# Patient Record
Sex: Male | Born: 1955 | Race: White | Hispanic: No | Marital: Married | Smoking: Former smoker
Health system: Southern US, Community
[De-identification: ages and names within clinical notes are randomized; demographics above are authoritative.]

## PROBLEM LIST (undated history)

## (undated) DIAGNOSIS — D499 Neoplasm of unspecified behavior of unspecified site: Secondary | ICD-10-CM

## (undated) DIAGNOSIS — M1990 Unspecified osteoarthritis, unspecified site: Secondary | ICD-10-CM

## (undated) DIAGNOSIS — E213 Hyperparathyroidism, unspecified: Secondary | ICD-10-CM

## (undated) DIAGNOSIS — I10 Essential (primary) hypertension: Secondary | ICD-10-CM

## (undated) HISTORY — PX: TONSILLECTOMY: SUR1361

## (undated) HISTORY — DX: Essential (primary) hypertension: I10

---

## 1996-03-05 HISTORY — PX: BACK SURGERY: SHX140

## 2005-09-18 ENCOUNTER — Encounter: Admission: RE | Admit: 2005-09-18 | Discharge: 2005-09-18 | Payer: Self-pay | Admitting: General Surgery

## 2005-11-13 ENCOUNTER — Ambulatory Visit (HOSPITAL_BASED_OUTPATIENT_CLINIC_OR_DEPARTMENT_OTHER): Admission: RE | Admit: 2005-11-13 | Discharge: 2005-11-13 | Payer: Self-pay | Admitting: General Surgery

## 2006-03-05 HISTORY — PX: HERNIA REPAIR: SHX51

## 2008-02-06 ENCOUNTER — Encounter: Admission: RE | Admit: 2008-02-06 | Discharge: 2008-02-06 | Payer: Self-pay | Admitting: General Surgery

## 2009-01-21 ENCOUNTER — Ambulatory Visit (HOSPITAL_BASED_OUTPATIENT_CLINIC_OR_DEPARTMENT_OTHER): Admission: RE | Admit: 2009-01-21 | Discharge: 2009-01-21 | Payer: Self-pay | Admitting: Urology

## 2009-09-19 ENCOUNTER — Ambulatory Visit (HOSPITAL_COMMUNITY): Admission: RE | Admit: 2009-09-19 | Discharge: 2009-09-19 | Payer: Self-pay | Admitting: Urology

## 2010-06-07 LAB — POCT I-STAT 4, (NA,K, GLUC, HGB,HCT)
Glucose, Bld: 107 mg/dL — ABNORMAL HIGH (ref 70–99)
HCT: 49 % (ref 39.0–52.0)
Hemoglobin: 16.7 g/dL (ref 13.0–17.0)
Potassium: 4.6 meq/L (ref 3.5–5.1)
Sodium: 141 meq/L (ref 135–145)

## 2010-07-21 NOTE — Op Note (Signed)
NAMEABIEL, ANTRIM               ACCOUNT NO.:  [id number]   MEDICAL RECORD NO.:  [id number]          PATIENT TYPE:  AMB   LOCATION:  [facility]                         FACILITY:  [facility]   PHYSICIAN:  Sharlet Salina T. Hoxworth, M.D.DATE OF BIRTH:  03-31-1955   DATE OF PROCEDURE:  11/13/2005  DATE OF DISCHARGE:                                 OPERATIVE REPORT   PREOPERATIVE DIAGNOSIS:  Left inguinal hernia.   POSTOPERATIVE DIAGNOSIS:  Left inguinal hernia.   SURGICAL PROCEDURES:  Repair of left inguinal hernia with mesh.   SURGEON:  Lorne Skeens. Hoxworth, M.D.   ANESTHESIA:  General.   BRIEF HISTORY:  Mr. Shellhammer is a 55 year old male with a gradually enlarging  and quite symptomatic left inguinal hernia confirmed on exam.  It was  difficult to reduce fully in the office.  I have recommended proceeding with  repair under general anesthesia with mesh.  The nature of the procedure,  indications, risks of bleeding, infection and recurrence of chronic pain  were discussed and understood.  He is now brought to the operating room for  this procedure.   DESCRIPTION OF OPERATION:  The patient was brought to the operating room and  placed in supine position on the operating table and general orotracheal  anesthesia was induced.  The left groin was sterilely prepped and draped.  He was given preoperative antibiotics.  The correct patient and procedure  were verified.  An oblique incision was made in the left groin and  dissection was carried down through subcutaneous tissue using cautery.  External oblique was identified and cleared and then divided along the lines  of its fibers to the external ring.  The ilioinguinal nerve was identified  and dissected free and protected throughout the remainder of the case.  The  cord was dissected up off the floor of the pubic tubercle.  Cremasteric  fibers were divided bilaterally up to the internal ring.  There was a large  cord lipoma that was dissected free  from cord structures using blunt and  cautery dissection and then clamped, divided and tied with 3-0 Vicryl.  There was noted to be a small to moderate direct hernia.  Dissection within  the cord revealed a large indirect sac.  It was completely dissected free  from cord structures up to the level of the internal ring, at which point it  was suture-ligated and divided and the stump retracted nicely up into the  dilated internal ring.  A piece of Parietex mesh was then trimmed to size to  fit the floor of the inguinal canal with tails going around the cord at the  internal ring.  The mesh was sutured initially to the pubic tubercle and  then to the inguinal ligament, working medial to lateral, with running 2-0  Prolene.  Medially the mesh was sutured to the edge of the rectus sheath  with interrupted 2-0 Prolene.  The tails were then tacked together lateral  to the internal ring, creating a new ring snug to a fingertip.  This  provided nice broad coverage of the direct and indirect spaces.  The cord  and ilioinguinal nerves were returned to their anatomic position.  The  external oblique was closed over this with running  3-0 Vicryl.  Scarpa fascia was closed with running 3-0 Vicryl and the skin  closed with running subcuticular 4-0 Monocryl and Steri-Strips.  Sponge and  needle counts correct.  A dry sterile dressing was applied and the patient  taken to the recovery room in good condition.      Lorne Skeens. Hoxworth, M.D.  Electronically Signed     BTH/MEDQ  D:  11/13/2005  T:  11/13/2005  Job:  [id number]

## 2013-10-13 ENCOUNTER — Encounter: Payer: Self-pay | Admitting: Internal Medicine

## 2013-10-13 ENCOUNTER — Ambulatory Visit (INDEPENDENT_AMBULATORY_CARE_PROVIDER_SITE_OTHER): Payer: BC Managed Care – PPO | Admitting: Internal Medicine

## 2013-10-13 NOTE — Progress Notes (Signed)
Patient ID: Fernando Johnson, male   DOB: [birth year], 58 y.o.   MRN: [id number]   HPI  Fernando Johnson is a 58 y.o.-year-old male, referred by his PCP, Dr. Melinda Crutch, for evaluation for hypercalcemia/hyperparathyroidism. He is here with his wife, who offers part of the history.  Pt was dx with hypercalcemia in 2015. I reviewed pt's pertinent labs (reviewed records from Dr. Harrington Challenger) - he had a high calcium, high PTH, and stage 2-3 CKD:  09/07/2013:  Calcium 11.6 (8.6-10.3) sodium 135 (136-145) BUN 20, creatinine 1.25, GFR 60 the rest of the CMP normal 09/21/2013: Calcium 11.6 Ionized calcium 6.4 (4.5-5.6) Intact PTH 67 (50-65) BUN 32, creatinine 1.43, GFR 51 the rest of the CMP normal  He had a lumbar fracture >> fell from a tree (years ago).  No h/o kidney stones. 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   Pt is not on (and has not been on) HCTZ.  No AP, constipation, depression.  No h/o vitamin D deficiency. No vit D level available to review.   Pt is not on calcium and vitamin D supplements; he also drinks milk but not a lot of green, leafy, vegetables.  Pt does not have a FH of hypercalcemia, pituitary tumors, thyroid cancer, or osteoporosis. Mother had scleroderma >> was on HD.   I reviewed his chart and he also has a history of HTN, inguinal hernia Sx and hydrocelle.   ROS: Constitutional: no weight gain/loss, no fatigue, no subjective hyperthermia/hypothermia Eyes: no blurry vision, no xerophthalmia ENT: no sore throat, no nodules palpated in throat, no dysphagia/odynophagia, no hoarseness Cardiovascular: no CP/SOB/palpitations/leg swelling Respiratory: no cough/SOB Gastrointestinal: no N/V/D/C Musculoskeletal: + both muscle/joint aches (back) Skin: no rashes, + easy bruising Neurological: no tremors/numbness/tingling/dizziness Psychiatric: no depression/anxiety  Past Medical History  Diagnosis Date  . Hypertension    hyperlipidemia  Past  Surgical History  Procedure Laterality Date  . Hernia repair  2008  . Back surgery  1998   History   Social History Main Topics  . Smoking status: Former Smoker    Quit date: 03/05/1990  . Smokeless tobacco: Not on file  . Alcohol Use: 2.0 oz/week    4 drink(s) per week  . Drug Use: No   Social History Narrative   Married   2 children (ages 45, 87 yrs)   Passenger transport manager   Quit smoking in [facility]  Name  Route  Sig  Dispense  Refill  . aspirin 81 MG tablet   Oral   Take 81 mg by mouth daily.         Marland Kitchen lisinopril (PRINIVIL,ZESTRIL) 20 MG tablet   Oral   Take 10 mg by mouth daily.          NKDA  Family History  Problem Relation Age of Onset  . Cancer Mother   . Hypertension Father   . Heart disease Father   . Hypertension Brother   . Hyperlipidemia Brother   . Heart disease Brother   . Cancer Brother   . Cancer Maternal Grandfather   . Heart disease Paternal Grandfather    PE: BP 118/60  Pulse 66  Temp(Src) 98.4 F (36.9 C) (Oral)  Resp 12  Ht 5' 10.5" (1.791 m)  Wt 212 lb (96.163 kg)  BMI 29.98 kg/m2  SpO2 96% Wt Readings from Last 3 Encounters:  10/13/13 212 lb (96.163 kg)   Constitutional: overweight, in NAD. No kyphosis. Ruddy complexion. Eyes: PERRLA, EOMI,  no exophthalmos ENT: moist mucous membranes, no thyromegaly, no cervical lymphadenopathy Cardiovascular: RRR, No MRG Respiratory: CTA B Gastrointestinal: abdomen soft, NT, ND, BS+ Musculoskeletal: no deformities, strength intact in all 4 Skin: moist, warm, no rashes Neurological: no tremor with outstretched hands, DTR normal in all 4  Assessment: 1. Hypercalcemia/hyperparathyroidism  Plan: Patient has had 2 instances of elevated calcium, at 11.6. A corresponding intact PTH level was also high, at 67. 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  No apparent complications from hypercalcemia: no h/o nephrolithiasis, no osteoporosis, no fractures (other than a traumatic fx after a fall from tree). No abdominal pain, depression, bone pain. - I discussed with the patient about the physiology of calcium and parathyroid hormone, and possible side effects from increased PTH, including kidney stones, osteoporosis, abdominal pain, etc.  - We discussed that we need to check whether his hyperparathyroidism is primary (Familial hypercalcemic hypocalciuria or parathyroid adenoma) or secondary (to conditions like: vitamin D deficiency, calcium malabsorption, hypercalciuria, renal insufficiency, etc.). - we discussed about criteria for parathyroid surgery - he meets 2:  Increased calcium by more than 1 mg/dL above the upper limit of normal  Kidney ds.  Osteoporosis (or Vb fx) Age <61 years old New (2013): High UCa >400 mg/d and increased stone risk by biochemical stone risk analysis Presence of nephrolithiasis or nephrocalcinosis Pt's preference!  - today we will check: calcium level intact PTH Magnesium Phosphorus vitamin D 25 HO  Vitamin D 1,25 HO Will check 24h urinary calcium if vit D normal - pt given instructions for urine collection and the jug - If the tests indicate a parathyroid adenoma, I will refer him to surgery. We discussed possible consequences of hyperparathyroidism: ~1/3 pts will develop complications over 15 years (OP, nephrolithiasis). He however, has high Ca levels >> would definitely go ahead with surgery if the labs indicate a PHPTH. He and his wife agree with plan. - for now, advised to stay well hydrated - I will wait for the results of the above labs and will discuss with the plan with the patient.  - I will see the patient back in 3 months  Component     Latest Ref Rng 10/13/2013  Sodium     135 - 145 mEq/L 136  Potassium     3.5 - 5.1 mEq/L 4.5  Chloride     96 - 112 mEq/L 109  CO2     19 - 32 mEq/L 22   Glucose     70 - 99 mg/dL 87  BUN     6 - 23 mg/dL 31 (H)  Creatinine     0.4 - 1.5 mg/dL 1.4  Calcium     8.4 - 10.5 mg/dL 11.5 (H)  GFR     >60.00 mL/min 58.07 (L)  Vitamin D 1, 25 (OH) Total     18 - 72 pg/mL 76 (H)  Vitamin D3 1, 25 (OH)      76  Vitamin D2 1, 25 (OH)      <8  PTH     14.0 - 72.0 pg/mL 115.2 (H)  VITD     30.00 - 100.00 ng/mL 33.96  Magnesium     1.5 - 2.5 mg/dL 2.0  Phosphorus     2.3 - 4.6 mg/dL 2.5  Free T4     0.60 - 1.60 ng/dL  1.09  T3, Free     2.3 - 4.2 pg/mL 2.8  TSH     0.35 - 4.50 uIU/mL 0.52  PTH ([facility]) and calcium still high. Vit D normal. 1,25 diHO vitamin D is also high. Mg normal. Phos normal. TFTs normal. He has slightly low kidney fxn.  Will check a 24 h urine calcium. Component     Latest Ref Rng 10/19/2013  Calcium, Ur      11  Calcium, 24 hour urine     100 - 250 mg/day 374 (H)  Creatinine, Urine      64.3  Creatinine, 24H Ur     800 - 2000 mg/day 2185 (H)  24h Ca high.  I believe he has primary hyperparathyroidism >> will refer to surgery.

## 2013-10-13 NOTE — Patient Instructions (Signed)
Please stay well hydrated. Please stop at the lab. Please try to join MyChart for easier communication. I will send you the results through [place]. I will let you know when to start the urine collection.  Patient information (Up-to-Date): Collection of a 24-hour urine specimen  - You should collect every drop of urine during each 24-hour period. It does not matter how much or little urine is passed each time, as long as every drop is collected. - Begin the urine collection in the morning after you wake up, after you have emptied your bladder for the first time. - Urinate (empty the bladder) for the first time and flush it down the toilet. Note the exact time (eg, 6:15 AM). You will begin the urine collection at this time. - Collect every drop of urine during the day and night in an empty collection bottle. Store the bottle at room temperature or in the refrigerator. - If you need to have a bowel movement, any urine passed with the bowel movement should be collected. Try not to include feces with the urine collection. If feces does get mixed in, do not try to remove the feces from the urine collection bottle. - Finish by collecting the first urine passed the next morning, adding it to the collection bottle. This should be within ten minutes before or after the time of the first morning void on the first day (which was flushed). In this example, you would try to void between 6:05 and 6:25 on the second day. - If you need to urinate one hour before the final collection time, drink a full glass of water so that you can void again at the appropriate time. If you have to urinate 20 minutes before, try to hold the urine until the proper time. - Please note the exact time of the final collection, even if it is not the same time as when collection began on day 1. - The bottle(s) may be kept at room temperature for a day or two, but should be kept cool or refrigerated for longer periods of time.  Please  return in 3 months with your sugar log.

## 2013-10-14 LAB — VITAMIN D 25 HYDROXY (VIT D DEFICIENCY, FRACTURES): VITD: 33.96 ng/mL (ref 30.00–100.00)

## 2013-10-14 LAB — T4, FREE: Free T4: 1.09 ng/dL (ref 0.60–1.60)

## 2013-10-14 LAB — BASIC METABOLIC PANEL
BUN: 31 mg/dL — ABNORMAL HIGH (ref 6–23)
CALCIUM: 11.5 mg/dL — AB (ref 8.4–10.5)
CO2: 22 meq/L (ref 19–32)
Chloride: 109 mEq/L (ref 96–112)
Creatinine, Ser: 1.4 mg/dL (ref 0.4–1.5)
GFR: 58.07 mL/min — ABNORMAL LOW (ref >60.00)
Glucose, Bld: 87 mg/dL (ref 70–99)
Potassium: 4.5 mEq/L (ref 3.5–5.1)
Sodium: 136 mEq/L (ref 135–145)

## 2013-10-14 LAB — PHOSPHORUS: Phosphorus: 2.5 mg/dL (ref 2.3–4.6)

## 2013-10-14 LAB — MAGNESIUM: MAGNESIUM: 2 mg/dL (ref 1.5–2.5)

## 2013-10-14 LAB — TSH: TSH: 0.52 u[IU]/mL (ref 0.35–4.50)

## 2013-10-14 LAB — T3, FREE: T3, Free: 2.8 pg/mL (ref 2.3–4.2)

## 2013-10-15 LAB — PARATHYROID HORMONE, INTACT (NO CA): PTH: 115.2 pg/mL — AB (ref 14.0–72.0)

## 2013-10-18 LAB — VITAMIN D 1,25 DIHYDROXY
VITAMIN D 1, 25 (OH) TOTAL: 76 pg/mL — AB (ref 18–72)
Vitamin D3 1, 25 (OH)2: 76 pg/mL

## 2013-10-19 ENCOUNTER — Other Ambulatory Visit: Payer: BC Managed Care – PPO

## 2013-10-20 ENCOUNTER — Encounter: Payer: Self-pay | Admitting: Internal Medicine

## 2013-10-20 LAB — CREATININE, URINE, 24 HOUR
CREATININE, URINE: 64.3 mg/dL
Creatinine, 24H Ur: 2185 mg/d — ABNORMAL HIGH (ref 800–2000)

## 2013-10-20 LAB — CALCIUM, URINE, 24 HOUR
Calcium, 24 hour urine: 374 mg/d — ABNORMAL HIGH (ref 100–250)
Calcium, Ur: 11 mg/dL

## 2013-11-16 ENCOUNTER — Ambulatory Visit (INDEPENDENT_AMBULATORY_CARE_PROVIDER_SITE_OTHER): Payer: BC Managed Care – PPO | Admitting: Surgery

## 2013-11-16 ENCOUNTER — Other Ambulatory Visit (INDEPENDENT_AMBULATORY_CARE_PROVIDER_SITE_OTHER): Payer: Self-pay

## 2013-11-16 DIAGNOSIS — E213 Hyperparathyroidism, unspecified: Secondary | ICD-10-CM

## 2013-11-26 ENCOUNTER — Encounter (HOSPITAL_COMMUNITY)
Admission: RE | Admit: 2013-11-26 | Discharge: 2013-11-26 | Disposition: A | Discharge status: Home / Self Care | Payer: BC Managed Care – PPO | Source: Ambulatory Visit | Attending: Surgery | Admitting: Surgery

## 2013-11-26 ENCOUNTER — Encounter (HOSPITAL_COMMUNITY)
Admission: RE | Admit: 2013-11-26 | Discharge: 2013-11-26 | Disposition: A | Discharge status: Home / Self Care | Payer: BC Managed Care – PPO | Source: Ambulatory Visit | Attending: Diagnostic Radiology | Admitting: Diagnostic Radiology

## 2013-11-26 DIAGNOSIS — E213 Hyperparathyroidism, unspecified: Secondary | ICD-10-CM | POA: Insufficient documentation

## 2013-11-26 MED ORDER — TECHNETIUM TC 99M SESTAMIBI - CARDIOLITE
25.6000 | Freq: Once | INTRAVENOUS | Status: AC | PRN
  Administered 2013-11-26: 10:00:00 26 via INTRAVENOUS

## 2013-11-30 ENCOUNTER — Telehealth (INDEPENDENT_AMBULATORY_CARE_PROVIDER_SITE_OTHER): Payer: Self-pay

## 2013-11-30 NOTE — Telephone Encounter (Signed)
Pt calling for scan result. To Dr Harlow Asa for review. Noted in allscripts.

## 2013-12-06 NOTE — Progress Notes (Signed)
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 Patient is a good candidate for minimally invasive out-patient surgery.  Left message on phones. Will contact on 10/5 to proceed with scheduling.  Earnstine Regal, MD, [facility] Office: [phone]    ______

## 2013-12-07 ENCOUNTER — Other Ambulatory Visit (INDEPENDENT_AMBULATORY_CARE_PROVIDER_SITE_OTHER): Payer: Self-pay | Admitting: Surgery

## 2013-12-14 ENCOUNTER — Encounter (HOSPITAL_COMMUNITY): Payer: Self-pay | Admitting: Pharmacy Technician

## 2013-12-16 ENCOUNTER — Encounter (HOSPITAL_COMMUNITY): Payer: Self-pay

## 2013-12-16 ENCOUNTER — Ambulatory Visit (HOSPITAL_COMMUNITY)
Admission: RE | Admit: 2013-12-16 | Discharge: 2013-12-16 | Disposition: A | Discharge status: Home / Self Care | Payer: BC Managed Care – PPO | Source: Ambulatory Visit | Attending: Anesthesiology | Admitting: Anesthesiology

## 2013-12-16 ENCOUNTER — Encounter (HOSPITAL_COMMUNITY)
Admission: RE | Admit: 2013-12-16 | Discharge: 2013-12-16 | Disposition: A | Discharge status: Home / Self Care | Payer: BC Managed Care – PPO | Source: Ambulatory Visit | Attending: Surgery | Admitting: Surgery

## 2013-12-16 DIAGNOSIS — E210 Primary hyperparathyroidism: Secondary | ICD-10-CM | POA: Diagnosis not present

## 2013-12-16 DIAGNOSIS — Z01818 Encounter for other preprocedural examination: Secondary | ICD-10-CM | POA: Insufficient documentation

## 2013-12-16 DIAGNOSIS — I10 Essential (primary) hypertension: Secondary | ICD-10-CM

## 2013-12-16 HISTORY — DX: Hyperparathyroidism, unspecified: E21.3

## 2013-12-16 HISTORY — DX: Unspecified osteoarthritis, unspecified site: M19.90

## 2013-12-16 HISTORY — DX: Neoplasm of unspecified behavior of unspecified site: D49.9

## 2013-12-16 LAB — CBC
HEMATOCRIT: 51.1 % (ref 39.0–52.0)
Hemoglobin: 18 g/dL — ABNORMAL HIGH (ref 13.0–17.0)
MCH: 31.2 pg (ref 26.0–34.0)
MCHC: 35.2 g/dL (ref 30.0–36.0)
MCV: 88.6 fL (ref 78.0–100.0)
Platelets: 122 10*3/uL — ABNORMAL LOW (ref 150–400)
RBC: 5.77 MIL/uL (ref 4.22–5.81)
RDW: 13.2 % (ref 11.5–15.5)
WBC: 7.5 10*3/uL (ref 4.0–10.5)

## 2013-12-16 LAB — BASIC METABOLIC PANEL
Anion gap: 12 (ref 5–15)
BUN: 24 mg/dL — AB (ref 6–23)
CHLORIDE: 103 meq/L (ref 96–112)
CO2: 23 meq/L (ref 19–32)
CREATININE: 1.34 mg/dL (ref 0.50–1.35)
Calcium: 11.6 mg/dL — ABNORMAL HIGH (ref 8.4–10.5)
GFR calc non Af Amer: 58 mL/min — ABNORMAL LOW (ref >90)
GFR, EST AFRICAN AMERICAN: 67 mL/min — AB (ref >90)
GLUCOSE: 116 mg/dL — AB (ref 70–99)
Potassium: 4.6 mEq/L (ref 3.7–5.3)
Sodium: 138 mEq/L (ref 137–147)

## 2013-12-16 NOTE — Progress Notes (Signed)
12/16/13 1441  OBSTRUCTIVE SLEEP APNEA  Have you ever been diagnosed with sleep apnea through a sleep study? No  Do you snore loudly (loud enough to be heard through closed doors)?  0  Do you often feel tired, fatigued, or sleepy during the daytime? 0 (NOW FEELING TIRED DUE TO HYPERPARATHYROID PROBLEM)  Has anyone observed you stop breathing during your sleep? 0  Do you have, or are you being treated for high blood pressure? 1  BMI more than 35 kg/m2? 0  Age over 41 years old? 1  Neck circumference greater than 40 cm/16 inches? 1  Gender: 1  Obstructive Sleep Apnea Score 4

## 2013-12-16 NOTE — Pre-Procedure Instructions (Signed)
EKG AND CXR WERE DONE TODAY - PREOP AT [facility]. 

## 2013-12-16 NOTE — Patient Instructions (Addendum)
YOUR SURGERY IS SCHEDULED AT [facility]  ON:  Tuesday  10/20  REPORT TO  SHORT STAY CENTER AT:  5:30 AM   DO NOT EAT OR DRINK ANYTHING AFTER MIDNIGHT THE NIGHT BEFORE YOUR SURGERY.  YOU MAY BRUSH YOUR TEETH, RINSE OUT YOUR MOUTH--BUT NO WATER, NO FOOD, NO CHEWING GUM, NO MINTS, NO CANDIES, NO CHEWING TOBACCO.  PLEASE TAKE THE FOLLOWING MEDICATIONS THE AM OF YOUR SURGERY WITH A FEW SIPS OF WATER:  NO MEDICATIONS TO TAKE THE AM OF SURGERY    DO NOT BRING VALUABLES, MONEY, CREDIT CARDS.  DO NOT WEAR JEWELRY, MAKE-UP, NAIL POLISH AND NO METAL PINS OR CLIPS IN YOUR HAIR. CONTACT LENS, DENTURES / PARTIALS, GLASSES SHOULD NOT BE WORN TO SURGERY AND IN MOST CASES-HEARING AIDS WILL NEED TO BE REMOVED.  BRING YOUR GLASSES CASE, ANY EQUIPMENT NEEDED FOR YOUR CONTACT LENS. FOR PATIENTS ADMITTED TO THE HOSPITAL--CHECK OUT TIME THE DAY OF DISCHARGE IS 11:00 AM.  ALL INPATIENT ROOMS ARE PRIVATE - WITH BATHROOM, TELEPHONE, TELEVISION AND WIFI INTERNET.  IF YOU ARE BEING DISCHARGED THE SAME DAY OF YOUR SURGERY--YOU CAN NOT DRIVE YOURSELF HOME--AND SHOULD NOT GO HOME ALONE BY TAXI OR BUS.  NO DRIVING OR OPERATING MACHINERY, OR MAKING LEGAL DECISIONS FOR 24 HOURS FOLLOWING ANESTHESIA / PAIN MEDICATIONS.  PLEASE MAKE ARRANGEMENTS FOR SOMEONE TO BE WITH YOU AT HOME THE FIRST 24 HOURS AFTER SURGERY. RESPONSIBLE DRIVER'S NAME / PHONE      PT'S WIFE WILL BE WITH HIM  CELL [phone]                                                     PLEASE BE AWARE THAT YOU MAY NEED ADDITIONAL BLOOD DRAWN DAY OF YOUR SURGERY  _______________________________________________________________________   [facility] - Preparing for Surgery Before surgery, you can play an important role.  Because skin is not sterile, your skin needs to be as free of germs as possible.  You can reduce the number of germs on your skin by washing with CHG (chlorahexidine gluconate) soap before surgery.  CHG is an antiseptic cleaner which  kills germs and bonds with the skin to continue killing germs even after washing. Please DO NOT use if you have an allergy to CHG or antibacterial soaps.  If your skin becomes reddened/irritated stop using the CHG and inform your nurse when you arrive at Short Stay. Do not shave (including legs and underarms) for at least 48 hours prior to the first CHG shower.  You may shave your face/neck. Please follow these instructions carefully:  1.  Shower with CHG Soap the night before surgery and the  morning of Surgery.  2.  If you choose to wash your hair, wash your hair first as usual with your  normal  shampoo.  3.  After you shampoo, rinse your hair and body thoroughly to remove the  shampoo.                           4.  Use CHG as you would any other liquid soap.  You can apply chg directly  to the skin and wash                       Gently with a scrungie or  clean washcloth.  5.  Apply the CHG Soap to your body ONLY FROM THE NECK DOWN.   Do not use on face/ open                           Wound or open sores. Avoid contact with eyes, ears mouth and genitals (private parts).                       Wash face,  Genitals (private parts) with your normal soap.             6.  Wash thoroughly, paying special attention to the area where your surgery  will be performed.  7.  Thoroughly rinse your body with warm water from the neck down.  8.  DO NOT shower/wash with your normal soap after using and rinsing off  the CHG Soap.                9.  Pat yourself dry with a clean towel.            10.  Wear clean pajamas.            11.  Place clean sheets on your bed the night of your first shower and do not  sleep with pets. Day of Surgery : Do not apply any lotions/deodorants the morning of surgery.  Please wear clean clothes to the hospital/surgery center.  FAILURE TO FOLLOW THESE INSTRUCTIONS MAY RESULT IN THE CANCELLATION OF YOUR SURGERY PATIENT SIGNATURE_________________________________  NURSE  SIGNATURE__________________________________  ________________________________________________________________________

## 2013-12-17 NOTE — Progress Notes (Signed)
Quick Note:  Pre-operative chest x-ray is acceptable for scheduled surgery.  Fernando Gracy M. Ziyah Cordoba, MD, [facility] Office: [phone]   ______ 

## 2013-12-17 NOTE — Progress Notes (Signed)
Quick Note:  These results are acceptable for scheduled surgery.  Celsey Asselin M. Jaimon Bugaj, MD, [facility] Office: [phone]   ______ 

## 2013-12-17 NOTE — Progress Notes (Signed)
Quick Note:  EKG is acceptable for scheduled surgery.  Landis Cassaro M. Nylah Butkus, MD, [facility] Office: [phone]   ______ 

## 2013-12-21 ENCOUNTER — Encounter (HOSPITAL_COMMUNITY): Payer: Self-pay | Admitting: Anesthesiology

## 2013-12-21 NOTE — Anesthesia Preprocedure Evaluation (Signed)
Anesthesia Evaluation  Patient identified by MRN, date of birth, ID band Patient awake    Reviewed: Allergy & Precautions, H&P , NPO status , Patient's Chart, lab work & pertinent test results  Airway Mallampati: II TM Distance: >3 FB Neck ROM: Full    Dental no notable dental hx.    Pulmonary former smoker,  breath sounds clear to auscultation  Pulmonary exam normal       Cardiovascular hypertension, Pt. on medications Rhythm:Regular Rate:Normal     Neuro/Psych negative neurological ROS  negative psych ROS   GI/Hepatic negative GI ROS, Neg liver ROS,   Endo/Other  negative endocrine ROS  Renal/GU negative Renal ROS  negative genitourinary   Musculoskeletal  (+) Arthritis -,   Abdominal (+) + obese,   Peds negative pediatric ROS (+)  Hematology negative hematology ROS (+)   Anesthesia Other Findings   Reproductive/Obstetrics negative OB ROS                           Anesthesia Physical Anesthesia Plan  ASA: II  Anesthesia Plan: General   Post-op Pain Management:    Induction: Intravenous  Airway Management Planned: Oral ETT  Additional Equipment:   Intra-op Plan:   Post-operative Plan: Extubation in OR  Informed Consent: I have reviewed the patients History and Physical, chart, labs and discussed the procedure including the risks, benefits and alternatives for the proposed anesthesia with the patient or authorized representative who has indicated his/her understanding and acceptance.   Dental advisory given  Plan Discussed with: CRNA  Anesthesia Plan Comments:         Anesthesia Quick Evaluation

## 2013-12-22 ENCOUNTER — Encounter (HOSPITAL_COMMUNITY): Payer: Self-pay | Admitting: *Deleted

## 2013-12-22 ENCOUNTER — Ambulatory Visit (HOSPITAL_COMMUNITY)
Admission: RE | Admit: 2013-12-22 | Discharge: 2013-12-22 | Disposition: A | Discharge status: Home / Self Care | Payer: BC Managed Care – PPO | Source: Ambulatory Visit | Attending: Surgery | Admitting: Surgery

## 2013-12-22 ENCOUNTER — Ambulatory Visit (HOSPITAL_COMMUNITY): Payer: BC Managed Care – PPO | Admitting: Anesthesiology

## 2013-12-22 ENCOUNTER — Encounter (HOSPITAL_COMMUNITY): Payer: BC Managed Care – PPO | Admitting: Anesthesiology

## 2013-12-22 ENCOUNTER — Other Ambulatory Visit (INDEPENDENT_AMBULATORY_CARE_PROVIDER_SITE_OTHER): Payer: Self-pay

## 2013-12-22 ENCOUNTER — Encounter (HOSPITAL_COMMUNITY)
Admission: RE | Disposition: A | Discharge status: Home / Self Care | Payer: Self-pay | Source: Ambulatory Visit | Attending: Surgery

## 2013-12-22 DIAGNOSIS — I10 Essential (primary) hypertension: Secondary | ICD-10-CM | POA: Insufficient documentation

## 2013-12-22 DIAGNOSIS — E210 Primary hyperparathyroidism: Secondary | ICD-10-CM | POA: Insufficient documentation

## 2013-12-22 DIAGNOSIS — E213 Hyperparathyroidism, unspecified: Secondary | ICD-10-CM

## 2013-12-22 HISTORY — PX: PARATHYROIDECTOMY: SHX19

## 2013-12-22 SURGERY — PARATHYROIDECTOMY
Anesthesia: General | Site: Neck

## 2013-12-22 MED ORDER — CISATRACURIUM BESYLATE 20 MG/10ML IV SOLN
INTRAVENOUS | Status: AC
  Filled 2013-12-22: qty 10

## 2013-12-22 MED ORDER — PROPOFOL 10 MG/ML IV BOLUS
INTRAVENOUS | Status: AC
  Filled 2013-12-22: qty 20

## 2013-12-22 MED ORDER — MIDAZOLAM HCL 5 MG/5ML IJ SOLN
INTRAMUSCULAR | Status: DC | PRN
  Administered 2013-12-22: 2 mg via INTRAVENOUS

## 2013-12-22 MED ORDER — HYDROMORPHONE HCL 1 MG/ML IJ SOLN
0.2500 mg | INTRAMUSCULAR | Status: DC | PRN
  Administered 2013-12-22 (×3): 0.5 mg via INTRAVENOUS

## 2013-12-22 MED ORDER — 0.9 % SODIUM CHLORIDE (POUR BTL) OPTIME
TOPICAL | Status: DC | PRN
  Administered 2013-12-22: 1000 mL

## 2013-12-22 MED ORDER — HYDROCODONE-ACETAMINOPHEN 5-325 MG PO TABS: 1.0000 | ORAL_TABLET | ORAL | Status: AC | PRN

## 2013-12-22 MED ORDER — DEXAMETHASONE SODIUM PHOSPHATE 10 MG/ML IJ SOLN
INTRAMUSCULAR | Status: AC
  Filled 2013-12-22: qty 1

## 2013-12-22 MED ORDER — HYDROMORPHONE HCL 1 MG/ML IJ SOLN
INTRAMUSCULAR | Status: AC
  Filled 2013-12-22: qty 1

## 2013-12-22 MED ORDER — MIDAZOLAM HCL 2 MG/2ML IJ SOLN
INTRAMUSCULAR | Status: AC
  Filled 2013-12-22: qty 2

## 2013-12-22 MED ORDER — DEXAMETHASONE SODIUM PHOSPHATE 10 MG/ML IJ SOLN
INTRAMUSCULAR | Status: DC | PRN
  Administered 2013-12-22: 10 mg via INTRAVENOUS

## 2013-12-22 MED ORDER — FENTANYL CITRATE 0.05 MG/ML IJ SOLN
INTRAMUSCULAR | Status: AC
  Filled 2013-12-22: qty 5

## 2013-12-22 MED ORDER — GLYCOPYRROLATE 0.2 MG/ML IJ SOLN
INTRAMUSCULAR | Status: DC | PRN
  Administered 2013-12-22: .6 mg via INTRAVENOUS

## 2013-12-22 MED ORDER — CISATRACURIUM BESYLATE (PF) 10 MG/5ML IV SOLN
INTRAVENOUS | Status: DC | PRN
  Administered 2013-12-22: 6 mg via INTRAVENOUS

## 2013-12-22 MED ORDER — ONDANSETRON HCL 4 MG/2ML IJ SOLN
INTRAMUSCULAR | Status: DC | PRN
  Administered 2013-12-22: 4 mg via INTRAVENOUS

## 2013-12-22 MED ORDER — CEFAZOLIN SODIUM-DEXTROSE 2-3 GM-% IV SOLR
2.0000 g | INTRAVENOUS | Status: AC
Start: 2013-12-22 — End: 2013-12-22
  Administered 2013-12-22: 2 g via INTRAVENOUS

## 2013-12-22 MED ORDER — BUPIVACAINE HCL (PF) 0.25 % IJ SOLN
INTRAMUSCULAR | Status: AC
  Filled 2013-12-22: qty 30

## 2013-12-22 MED ORDER — CEFAZOLIN SODIUM-DEXTROSE 2-3 GM-% IV SOLR
INTRAVENOUS | Status: AC
  Filled 2013-12-22: qty 50

## 2013-12-22 MED ORDER — PROMETHAZINE HCL 25 MG/ML IJ SOLN: 6.2500 mg | INTRAMUSCULAR | Status: DC | PRN

## 2013-12-22 MED ORDER — BUPIVACAINE HCL 0.25 % IJ SOLN
INTRAMUSCULAR | Status: DC | PRN
  Administered 2013-12-22: 10 mL

## 2013-12-22 MED ORDER — LIDOCAINE HCL (CARDIAC) 20 MG/ML IV SOLN
INTRAVENOUS | Status: DC | PRN
  Administered 2013-12-22: 60 mg via INTRAVENOUS

## 2013-12-22 MED ORDER — NEOSTIGMINE METHYLSULFATE 10 MG/10ML IV SOLN
INTRAVENOUS | Status: DC | PRN
  Administered 2013-12-22: 4 mg via INTRAVENOUS

## 2013-12-22 MED ORDER — LIDOCAINE HCL (CARDIAC) 20 MG/ML IV SOLN
INTRAVENOUS | Status: AC
  Filled 2013-12-22: qty 5

## 2013-12-22 MED ORDER — LACTATED RINGERS IV SOLN: INTRAVENOUS | Status: DC

## 2013-12-22 MED ORDER — GLYCOPYRROLATE 0.2 MG/ML IJ SOLN
INTRAMUSCULAR | Status: AC
  Filled 2013-12-22: qty 3

## 2013-12-22 MED ORDER — NEOSTIGMINE METHYLSULFATE 10 MG/10ML IV SOLN
INTRAVENOUS | Status: AC
  Filled 2013-12-22: qty 1

## 2013-12-22 MED ORDER — PROPOFOL 10 MG/ML IV BOLUS
INTRAVENOUS | Status: DC | PRN
  Administered 2013-12-22: 150 mg via INTRAVENOUS

## 2013-12-22 MED ORDER — FENTANYL CITRATE 0.05 MG/ML IJ SOLN
INTRAMUSCULAR | Status: DC | PRN
  Administered 2013-12-22: 100 ug via INTRAVENOUS
  Administered 2013-12-22: 50 ug via INTRAVENOUS

## 2013-12-22 MED ORDER — LACTATED RINGERS IV SOLN
INTRAVENOUS | Status: DC | PRN
  Administered 2013-12-22: 07:00:00 via INTRAVENOUS

## 2013-12-22 MED ORDER — HYDROCODONE-ACETAMINOPHEN 5-325 MG PO TABS
1.0000 | ORAL_TABLET | ORAL | Status: DC | PRN
  Administered 2013-12-22: 1 via ORAL
  Filled 2013-12-22: qty 1

## 2013-12-22 MED ORDER — ONDANSETRON HCL 4 MG/2ML IJ SOLN
INTRAMUSCULAR | Status: AC
  Filled 2013-12-22: qty 2

## 2013-12-22 MED ORDER — SUCCINYLCHOLINE CHLORIDE 20 MG/ML IJ SOLN
INTRAMUSCULAR | Status: DC | PRN
  Administered 2013-12-22: 100 mg via INTRAVENOUS

## 2013-12-22 SURGICAL SUPPLY — 40 items
ATTRACTOMAT 16X20 MAGNETIC DRP (DRAPES) ×3 IMPLANT
BENZOIN TINCTURE PRP APPL 2/3 (GAUZE/BANDAGES/DRESSINGS) ×3 IMPLANT
BLADE HEX COATED 2.75 (ELECTRODE) ×3 IMPLANT
BLADE SURG 15 STRL LF DISP TIS (BLADE) ×1 IMPLANT
BLADE SURG 15 STRL SS (BLADE) ×2
CANISTER SUCTION 2500CC (MISCELLANEOUS) ×3 IMPLANT
CHLORAPREP W/TINT 10.5 ML (MISCELLANEOUS) IMPLANT
CHLORAPREP W/TINT 26ML (MISCELLANEOUS) ×3 IMPLANT
CLIP TI MEDIUM 6 (CLIP) ×6 IMPLANT
CLIP TI WIDE RED SMALL 6 (CLIP) ×6 IMPLANT
CLOSURE WOUND 1/2 X4 (GAUZE/BANDAGES/DRESSINGS) ×1
DISSECTOR ROUND CHERRY 3/8 STR (MISCELLANEOUS) ×3 IMPLANT
DRAPE PED LAPAROTOMY (DRAPES) ×3 IMPLANT
DRESSING SURGICEL FIBRLLR 1X2 (HEMOSTASIS) ×1 IMPLANT
DRSG SURGICEL FIBRILLAR 1X2 (HEMOSTASIS) ×3
ELECT REM PT RETURN 9FT ADLT (ELECTROSURGICAL) ×3
ELECTRODE REM PT RTRN 9FT ADLT (ELECTROSURGICAL) ×1 IMPLANT
GAUZE SPONGE 4X4 12PLY STRL (GAUZE/BANDAGES/DRESSINGS) ×3 IMPLANT
GAUZE SPONGE 4X4 16PLY XRAY LF (GAUZE/BANDAGES/DRESSINGS) ×3 IMPLANT
GLOVE SURG ORTHO 8.0 STRL STRW (GLOVE) ×3 IMPLANT
GOWN STRL REUS W/TWL XL LVL3 (GOWN DISPOSABLE) ×6 IMPLANT
KIT BASIN OR (CUSTOM PROCEDURE TRAY) ×3 IMPLANT
NEEDLE HYPO 25X1 1.5 SAFETY (NEEDLE) ×3 IMPLANT
NS IRRIG 1000ML POUR BTL (IV SOLUTION) ×3 IMPLANT
PACK BASIC VI WITH GOWN DISP (CUSTOM PROCEDURE TRAY) ×3 IMPLANT
PENCIL BUTTON HOLSTER BLD 10FT (ELECTRODE) ×3 IMPLANT
STAPLER VISISTAT 35W (STAPLE) ×3 IMPLANT
STRIP CLOSURE SKIN 1/2X4 (GAUZE/BANDAGES/DRESSINGS) ×2 IMPLANT
SUT MNCRL AB 4-0 PS2 18 (SUTURE) ×3 IMPLANT
SUT SILK 2 0 (SUTURE)
SUT SILK 2-0 18XBRD TIE 12 (SUTURE) IMPLANT
SUT SILK 3 0 (SUTURE)
SUT SILK 3-0 18XBRD TIE 12 (SUTURE) IMPLANT
SUT VIC AB 3-0 SH 18 (SUTURE) ×3 IMPLANT
SYR BULB IRRIGATION 50ML (SYRINGE) ×3 IMPLANT
SYR CONTROL 10ML LL (SYRINGE) ×3 IMPLANT
TAPE CLOTH SURG 4X10 WHT LF (GAUZE/BANDAGES/DRESSINGS) ×3 IMPLANT
TOWEL OR 17X26 10 PK STRL BLUE (TOWEL DISPOSABLE) ×3 IMPLANT
TOWEL OR NON WOVEN STRL DISP B (DISPOSABLE) ×3 IMPLANT
YANKAUER SUCT BULB TIP 10FT TU (MISCELLANEOUS) ×3 IMPLANT

## 2013-12-22 NOTE — H&P (Signed)
General Surgery [facility]  Reed Pandy. Starkey 11/16/2013 11:22 AM Location: [facility] Patient #: [id number] DOB: Jan 05, 1956 Married / Language: Fernando Johnson / Race: White Male  History of Present Illness Earnstine Regal MD; 11/16/2013 12:02 PM) The patient is a 58 year old male who presents with a parathyroid neoplasm. Patient is referred by Dr. Philemon Kingdom for evaluation of suspected primary hyperparathyroidism. Patient's primary care physician is Dr. Myriam Jacobson. Patient had been noted on routine laboratory studies to have a persistently elevated serum calcium level. Recent calcium level is 11.2 with an intact PTH level of 67. Vitamin D level was normal at 76. 24-hour urine collection for calcium was elevated at 374. Patient is referred at this time for further evaluation and recommendations. Patient has no prior history of endocrine disease. He has had no prior head or neck surgery. There is no family history of parathyroid disease and no history of other endocrine neoplasms.   Other Problems [facility] [place], RMA; 11/16/2013 11:22 AM) High blood pressure Inguinal Hernia  Past Surgical History [facility] [place], RMA; 11/16/2013 11:22 AM) Open Inguinal Hernia Surgery Left. Spinal Surgery - Lower Back Tonsillectomy  Diagnostic Studies History [facility] [place], [place]; 11/16/2013 11:22 AM) Colonoscopy 1-5 years ago  Allergies [facility] [place], RMA; 11/16/2013 11:22 AM) No Known Drug Allergies09/14/2015  Medication History (Dahionnarah Maldonado, RMA; 11/16/2013 11:24 AM) Lisinopril (20MG  Tablet, Oral) Active. Aspirin Adult Low Dose (81MG  Tablet DR, Oral) Active. Fish Oil (1000MG  Capsule DR, Oral) Active. Medications Reconciled  Social History Alexander Bergeron [place], [place]; 11/16/2013 11:22 AM) Alcohol use Occasional alcohol use. Caffeine use Coffee. Illicit drug use Remotely quit drug use. Tobacco use Former  smoker.  Family History Alexander Bergeron [place], [place]; 11/16/2013 11:22 AM) Cancer Brother, Mother. Cerebrovascular Accident Mother. Heart Disease Brother, Father.  Review of Systems ([place] RMA; 11/16/2013 11:22 AM) General Not Present- Appetite Loss, Chills, Fatigue, Fever, Night Sweats, Weight Gain and Weight Loss. Skin Not Present- Change in Wart/Mole, Dryness, Hives, Jaundice, New Lesions, Non-Healing Wounds, Rash and Ulcer. HEENT Not Present- Earache, Hearing Loss, Hoarseness, Nose Bleed, Oral Ulcers, Ringing in the Ears, Seasonal Allergies, Sinus Pain, Sore Throat, Visual Disturbances, Wears glasses/contact lenses and Yellow Eyes. Respiratory Not Present- Bloody sputum, Chronic Cough, Difficulty Breathing, Snoring and Wheezing. Breast Not Present- Breast Mass, Breast Pain, Nipple Discharge and Skin Changes. Cardiovascular Not Present- Chest Pain, Difficulty Breathing Lying Down, Leg Cramps, Palpitations, Rapid Heart Rate, Shortness of Breath and Swelling of Extremities. Gastrointestinal Not Present- Abdominal Pain, Bloating, Bloody Stool, Change in Bowel Habits, Chronic diarrhea, Constipation, Difficulty Swallowing, Excessive gas, Gets full quickly at meals, Hemorrhoids, Indigestion, Nausea, Rectal Pain and Vomiting. Male Genitourinary Not Present- Blood in Urine, Change in Urinary Stream, Frequency, Impotence, Nocturia, Painful Urination, Urgency and Urine Leakage. Musculoskeletal Not Present- Back Pain, Joint Pain, Joint Stiffness, Muscle Pain, Muscle Weakness and Swelling of Extremities. Neurological Not Present- Decreased Memory, Fainting, Headaches, Numbness, Seizures, Tingling, Tremor, Trouble walking and Weakness. Psychiatric Not Present- Anxiety, Bipolar, Change in Sleep Pattern, Depression, Fearful and Frequent crying. Endocrine Not Present- Cold Intolerance, Excessive Hunger, Hair Changes, Heat Intolerance, Hot flashes and New Diabetes. Hematology Present- Easy  Bruising. Not Present- Excessive bleeding, Gland problems, HIV and Persistent Infections.   Vitals (Dahionnarah Maldonado RMA; 11/16/2013 11:26 AM) 11/16/2013 11:25 AM Weight: 217.8 lb Height: 71in Body Surface Area: 2.22 m Body Mass Index: 30.38 kg/m Temp.: 66F(Oral)  Pulse: 70 (Regular)  P.OX: 97% (Room air) BP: 96/68 (Sitting, Left Arm, Standard)    Physical  Exam Earnstine Regal MD; 11/16/2013 12:03 PM) General Mental Status-Alert. General Appearance-Consistent with stated age. Hydration-Well hydrated. Voice-Normal.  Head and Neck Head-normocephalic, atraumatic with no lesions or palpable masses. Trachea-midline. Thyroid Gland Characteristics - normal size and consistency. Note: Thyroid lobes are soft and normal in consistency bilaterally. No palpable nodules. No palpable masses. No tenderness. No anterior or posterior cervical lymphadenopathy.  Eye Eyeball - Bilateral-Extraocular movements intact. Sclera/Conjunctiva - Bilateral-No scleral icterus.  Chest and Lung Exam Chest and lung exam reveals -quiet, even and easy respiratory effort with no use of accessory muscles, normal resonance, no flatness or dullness, non-tender and normal tactile fremitus and on auscultation, normal breath sounds, no adventitious sounds and normal vocal resonance. Inspection Chest Wall - Normal. Back - normal.  Cardiovascular Cardiovascular examination reveals -on palpation PMI is normal in location and amplitude, no palpable S3 or S4. Normal cardiac borders., normal heart sounds, regular rate and rhythm with no murmurs, carotid auscultation reveals no bruits and normal pedal pulses bilaterally.  Neurologic Neurologic evaluation reveals -alert and oriented x 3 with no impairment of recent or remote memory. Mental Status-Normal.  Musculoskeletal Normal Exam - Left-Upper Extremity Strength Normal and Lower Extremity Strength Normal. Normal Exam -  Right-Upper Extremity Strength Normal, Lower Extremity Weakness.    Assessment & Plan Earnstine Regal MD; 11/16/2013 12:05 PM) PRIMARY HYPERPARATHYROIDISM (252.01  E21.0) Impression: I discussed the above findings at length with the patient and his wife.I have recommended proceeding with nuclear medicine parathyroid scan. We discussed the possibility of a single gland adenoma.This would be amenable to minimally invasive surgery. We discussed the risk and benefits of this procedure including the risk of recurrent laryngeal nerve injury. We discussed the possibility of a second gland adenoma and the potential need for additional surgery in the future. Patient understands and wishes to proceed with further testing anticipating having outpatient minimally invasive surgery at some point in the near future.  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  Will proceed with minimally invasive parathyroidectomy.  The risks and benefits of the procedure have been discussed at length with the patient.  The patient understands the proposed procedure, potential alternative treatments, and the course of recovery to be expected.  All of the patient's questions have been answered at this time.  The patient wishes to proceed with surgery.  Earnstine Regal, MD, [facility] Office: [phone]

## 2013-12-22 NOTE — Transfer of Care (Signed)
Immediate Anesthesia Transfer of Care Note  Patient: Fernando Johnson  Procedure(s) Performed: Procedure(s): PARATHYROIDECTOMY (N/A)  Patient Location: PACU  Anesthesia Type:General  Level of Consciousness: awake, alert , oriented and patient cooperative  Airway & Oxygen Therapy: Patient Spontanous Breathing and Patient connected to face mask oxygen  Post-op Assessment: Report given to PACU RN and Post -op Vital signs reviewed and stable  Post vital signs: Reviewed and stable  Complications: No apparent anesthesia complications

## 2013-12-22 NOTE — Op Note (Signed)
OPERATIVE REPORT - PARATHYROIDECTOMY  Preoperative diagnosis: Primary hyperparathyroidism  Postop diagnosis: Same  Procedure: Right superior minimally invasive parathyroidectomy  Surgeon:  Earnstine Regal, MD, FACS  Anesthesia: Gen. endotracheal  Estimated blood loss: Minimal  Preparation: ChloraPrep  Indications: Patient had been noted on routine laboratory studies to have a persistently elevated serum calcium level. Recent calcium level is 11.2 with an intact PTH level of 67. Vitamin D level was normal at 76. 24-hour urine collection for calcium was elevated at 374. Nuclear medicine parathyroid scan localizes an adenoma to the right inferior position.  Procedure: Patient was prepared in the holding area. He was brought to operating room and placed in a supine position on the operating room table. Following administration of general anesthesia, the patient was positioned and then prepped and draped in the usual strict aseptic fashion. After ascertaining that an adequate level of anesthesia been achieved, a neck incision was made with a #15 blade. Dissection was carried through subcutaneous tissues and platysma. Hemostasis was obtained with the electrocautery. Skin flaps were developed circumferentially and a Weitlander retractor was placed for exposure.  Strap muscles were incised in the midline. Strap muscles were reflected exposing the thyroid lobe. With gentle blunt dissection the thyroid lobe was mobilized.  Dissection was carried through adipose tissue and an enlarged parathyroid gland was identified. It was gently mobilized. This appeared to be a superior gland based on the orientation of the vasculature. Vascular structures were divided between small and medium ligaclips. Care was taken to avoid the recurrent laryngeal nerve and the esophagus. The parathyroid gland was completely excised. It was submitted to pathology where frozen section confirmed parathyroid tissue consistent with  adenoma.  Neck was irrigated with warm saline and good hemostasis was noted. Fibrillar was placed in the operative field. Strap muscles were reapproximated in the midline with interrupted 3-0 Vicryl sutures. Platysma was closed with interrupted 3-0 Vicryl sutures. Skin was closed with a running 4-0 Monocryl subcuticular suture. Marcaine was infiltrated circumferentially. Wound was washed and dried and benzoin and Steri-Strips were applied. Sterile gauze dressings were applied. Patient was awakened from anesthesia and brought to the recovery room. The patient tolerated the procedure well.   Earnstine Regal, MD, [facility]

## 2013-12-22 NOTE — Anesthesia Postprocedure Evaluation (Signed)
  Anesthesia Post-op Note  Patient: Fernando Johnson  Procedure(s) Performed: Procedure(s) (LRB): PARATHYROIDECTOMY (N/A)  Patient Location: PACU  Anesthesia Type: General  Level of Consciousness: awake and alert   Airway and Oxygen Therapy: Patient Spontanous Breathing  Post-op Pain: mild  Post-op Assessment: Post-op Vital signs reviewed, Patient's Cardiovascular Status Stable, Respiratory Function Stable, Patent Airway and No signs of Nausea or vomiting  Last Vitals:  Filed Vitals:   12/22/13 1043  BP: 113/68  Pulse: 65  Temp:   Resp: 16    Post-op Vital Signs: stable   Complications: No apparent anesthesia complications

## 2013-12-23 ENCOUNTER — Encounter (HOSPITAL_COMMUNITY): Payer: Self-pay | Admitting: Surgery

## 2014-01-15 ENCOUNTER — Ambulatory Visit: Payer: BC Managed Care – PPO | Admitting: Internal Medicine

## 2014-01-16 LAB — PTH, INTACT AND CALCIUM
Calcium: 9.2 mg/dL (ref 8.7–10.2)
PTH: 35 pg/mL (ref 15–65)

## 2015-04-19 IMAGING — NM NM PARATHYROID W/ SPECT
4 series · 24 of 24 positions shown · non-contrast
Comparison: None.

CLINICAL DATA: Hyperparathyroidism.

EXAM:
NM PARATHYROID SCINTIGRAPHY AND SPECT IMAGING
TECHNIQUE: Following intravenous administration of radiopharmaceutical, early
and 2-hour delayed planar images were obtained in the anterior
projection. Delayed triplanar SPECT images were also obtained at 2
hours.
RADIOPHARMACEUTICALS:  25.6 mAi0c-XXm Sestamibi IV

[Series 1: pa parathyroid · 4.7mm · 4.75mm/px · 6 of 91 frames shown (1 of 4)]
[frame 8/91]
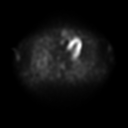
[frame 23/91]
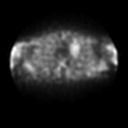
[frame 38/91]
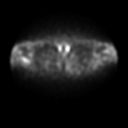
[frame 53/91]
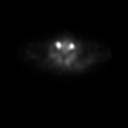
[frame 68/91]
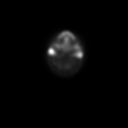
[frame 84/91]
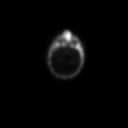

[Series 1: pa parathyroid · 4.75mm/px · 6 of 128 frames shown (2 of 4)]
[frame 11/128  full-range]
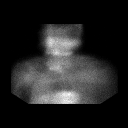
[frame 32/128  full-range]
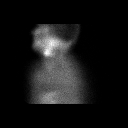
[frame 54/128  full-range]
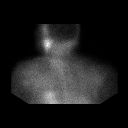
[frame 75/128  full-range]
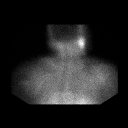
[frame 96/128  full-range]
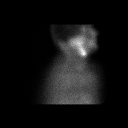
[frame 118/128  full-range]
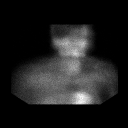

[Series 1: pa parathyroid · 4.7mm · 4.75mm/px · 6 of 91 frames shown (3 of 4)]
[frame 8/91]
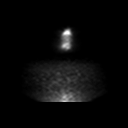
[frame 23/91]
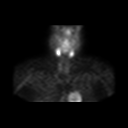
[frame 38/91]
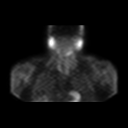
[frame 53/91]
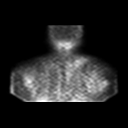
[frame 68/91]
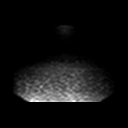
[frame 84/91]
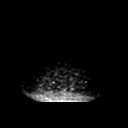

[Series 1: pa parathyroid · 4.7mm · 4.75mm/px · 6 of 91 frames shown (4 of 4)]
[frame 8/91]
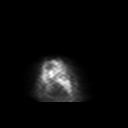
[frame 23/91]
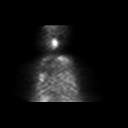
[frame 38/91]
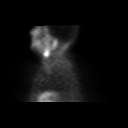
[frame 53/91]
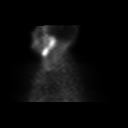
[frame 68/91]
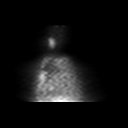
[frame 84/91]
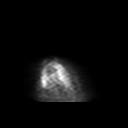

[24 of 24 positions shown; findings below may reference images not displayed]

FINDINGS: On the early images there is uniform tracer uptake within both lobes
of the thyroid gland. On the 2 hr delayed images there is partial
washout of the radiopharmaceutical from the thyroid gland. A
persistent focus of increased uptake localizes to the inferior pole
of the right lobe.
IMPRESSION: 1. Persistent focus of increased radiotracer uptake localizing to
the inferior pole of the right lobe of thyroid gland may represent a
parathyroid adenoma.

## 2016-07-02 ENCOUNTER — Emergency Department (HOSPITAL_COMMUNITY)
Admission: EM | Admit: 2016-07-02 | Discharge: 2016-07-02 | Disposition: A | Discharge status: Home / Self Care | Payer: BLUE CROSS/BLUE SHIELD | Attending: Emergency Medicine | Admitting: Emergency Medicine

## 2016-07-02 ENCOUNTER — Encounter (HOSPITAL_COMMUNITY): Payer: Self-pay | Admitting: Emergency Medicine

## 2016-07-02 DIAGNOSIS — Z7982 Long term (current) use of aspirin: Secondary | ICD-10-CM | POA: Insufficient documentation

## 2016-07-02 DIAGNOSIS — T783XXA Angioneurotic edema, initial encounter: Secondary | ICD-10-CM | POA: Diagnosis not present

## 2016-07-02 DIAGNOSIS — Z87891 Personal history of nicotine dependence: Secondary | ICD-10-CM | POA: Diagnosis not present

## 2016-07-02 DIAGNOSIS — Z79899 Other long term (current) drug therapy: Secondary | ICD-10-CM | POA: Insufficient documentation

## 2016-07-02 DIAGNOSIS — T7840XA Allergy, unspecified, initial encounter: Secondary | ICD-10-CM | POA: Diagnosis present

## 2016-07-02 DIAGNOSIS — I10 Essential (primary) hypertension: Secondary | ICD-10-CM | POA: Diagnosis not present

## 2016-07-02 MED ORDER — DIPHENHYDRAMINE HCL 50 MG/ML IJ SOLN
50.0000 mg | Freq: Once | INTRAMUSCULAR | Status: AC
  Administered 2016-07-02: 50 mg via INTRAVENOUS
  Filled 2016-07-02: qty 1

## 2016-07-02 MED ORDER — METHYLPREDNISOLONE SODIUM SUCC 125 MG IJ SOLR
125.0000 mg | Freq: Once | INTRAMUSCULAR | Status: AC
  Administered 2016-07-02: 125 mg via INTRAVENOUS
  Filled 2016-07-02: qty 2

## 2016-07-02 MED ORDER — SODIUM CHLORIDE 0.9 % IV BOLUS (SEPSIS)
1000.0000 mL | Freq: Once | INTRAVENOUS | Status: AC
  Administered 2016-07-02: 1000 mL via INTRAVENOUS

## 2016-07-02 MED ORDER — FAMOTIDINE IN NACL 20-0.9 MG/50ML-% IV SOLN
20.0000 mg | Freq: Once | INTRAVENOUS | Status: AC
  Administered 2016-07-02: 20 mg via INTRAVENOUS
  Filled 2016-07-02: qty 50

## 2016-07-02 NOTE — ED Triage Notes (Signed)
Pt states he was out in the yard doing yard work and his lips started swelling then his throat started swelling and his hands started to tingle and his skin got all red  Pt has redness noted all over and some swelling noted to his bottom lip and his uvula is swollen  Denies difficulty swallowing but states his throat feels like it is closing up

## 2016-07-02 NOTE — ED Notes (Signed)
Pt states he is feeling better, dr. Wilson Singer notified of the same.

## 2016-07-02 NOTE — ED Provider Notes (Signed)
[facility] DEPT Provider Note   CSN: [id number] Arrival date & time: 07/02/16  2035  By signing my name below, I, Dora Sims, attest that this documentation has been prepared under the direction and in the presence of physician practitioner, Virgel Manifold, MD. Electronically Signed: Dora Sims, Scribe. 07/02/2016. 8:45 PM.  History   Chief Complaint Chief Complaint  Patient presents with  . Allergic Reaction   The history is provided by the patient. No language interpreter was used.   HPI Comments: Fernando Johnson is a 61 y.o. male with PMHx including HTN and hyperparathyroidism who presents to the Emergency Department for evaluation of an allergic reaction beginning 30 minutes ago. He endorses lower lip swelling, throat "tightness", and some tingling in his bilateral hands. He is also reporting some mild abdominal discomfort. Pt notes he was changing electrical outlets shortly prior to onset of his symptoms but does not indicate any specific trigger for his symptoms. No h/o the same. No recent unusual outdoor exposure or new foods. He uses Lisinopril daily for HTN. Patient denies any pruritic sensations, sore throat, coughing, wheezing, dizziness, lightheadedness, nausea, vomiting, or any other associated symptoms.  Past Medical History:  Diagnosis Date  . Arthritis    CHRONIC BACK PAIN- HX OF SPINAL FUSION; PAIN  IN LEFT FOOT - DROPPED A RAILROAD TIE ON FOOT  . Hyperparathyroidism ([place])    CALCIUM LEVELS ELEVATED  . Hypertension   . Tumors    I HAVE BENIGN TUMORS ON KIDNEYS     Patient Active Problem List   Diagnosis Date Noted  . Hyperparathyroidism, primary ([place]) 12/22/2013    Past Surgical History:  Procedure Laterality Date  . [place]  . HERNIA REPAIR  2008   INGUINAL  . PARATHYROIDECTOMY N/A 12/22/2013   Procedure: PARATHYROIDECTOMY;  Surgeon: Armandina Gemma, MD;  Location: [facility] ORS;  Service: General;  Laterality: N/A;  . TONSILLECTOMY     T&A  AS A CHILD       Home Medications    Prior to Admission medications   Medication Sig Start Date End Date Taking? Authorizing Provider  aspirin 81 MG tablet Take 81 mg by mouth daily.    Historical Provider, MD  HYDROcodone-acetaminophen (NORCO/VICODIN) 5-325 MG per tablet Take 1-2 tablets by mouth every 4 (four) hours as needed for moderate pain. 12/22/13   Armandina Gemma, MD  lisinopril (PRINIVIL,ZESTRIL) 20 MG tablet Take 10 mg by mouth every morning.     Historical Provider, MD  OVER THE COUNTER MEDICATION FISH OIL  OVER THE COUNTER 3 A DAY    Historical Provider, MD  TURMERIC PO Take 2 tablets by mouth daily.     Historical Provider, MD    Family History Family History  Problem Relation Age of Onset  . Cancer Mother   . Hypertension Father   . Heart disease Father   . Hypertension Brother   . Hyperlipidemia Brother   . Heart disease Brother   . Cancer Brother   . Cancer Maternal Grandfather   . Heart disease Paternal Grandfather     Social History Social History  Substance Use Topics  . Smoking status: Former Smoker    Packs/day: 1.00    Years: 20.00    Types: Cigarettes    Quit date: 03/05/1990  . Smokeless tobacco: Former Systems developer  . Alcohol use 2.0 oz/week    4 Standard drinks or equivalent per week     Comment: Moderate     Allergies  Patient has no known allergies.   Review of Systems Review of Systems  HENT: Negative for sore throat.        Positive for uvula and lower lip swelling.  Respiratory: Negative for cough and wheezing.   Gastrointestinal: Positive for abdominal pain. Negative for nausea and vomiting.  Neurological: Positive for numbness (tingling). Negative for dizziness and light-headedness.  All other systems reviewed and are negative.  Physical Exam Updated Vital Signs BP 120/82 (BP Location: Left Arm)   Pulse (!) 124   Temp 97.6 F (36.4 C) (Oral)   Resp 18   SpO2 98%   Physical Exam  Constitutional: He appears well-developed  and well-nourished.  HENT:  Head: Normocephalic.  Right Ear: External ear normal.  Left Ear: External ear normal.  Nose: Nose normal.  Mouth/Throat: Uvula is midline. Uvula swelling present.  Mild swelling of lower lip. Uvula edematous but midline. Oropharynx otherwise clear.  Eyes: Conjunctivae are normal. Right eye exhibits no discharge. Left eye exhibits no discharge.  Neck: Normal range of motion.  Cardiovascular: Normal rate, regular rhythm and normal heart sounds.   No murmur heard. Pulmonary/Chest: Effort normal and breath sounds normal. No respiratory distress. He has no wheezes. He has no rales.  Abdominal: Soft. There is no tenderness. There is no rebound and no guarding.  Musculoskeletal: Normal range of motion. He exhibits no edema or tenderness.  Neurological: He is alert. No cranial nerve deficit. Coordination normal.  Skin: Skin is warm and dry. Rash noted. No erythema. No pallor.  Splotchy erythematous body rash.  Psychiatric: He has a normal mood and affect. His behavior is normal.  Nursing note and vitals reviewed.  ED Treatments / Results  Labs (all labs ordered are listed, but only abnormal results are displayed) Labs Reviewed - No data to display  EKG  EKG Interpretation None       Radiology No results found.  Procedures Procedures (including critical care time)  DIAGNOSTIC STUDIES: Oxygen Saturation is 98% on RA, normal by my interpretation.    COORDINATION OF CARE: 8:53 PM Discussed treatment plan with pt at bedside and pt agreed to plan.  Medications Ordered in ED Medications  famotidine (PEPCID) IVPB 20 mg premix (not administered)  methylPREDNISolone sodium succinate (SOLU-MEDROL) 125 mg/2 mL injection 125 mg (not administered)  sodium chloride 0.9 % bolus 1,000 mL (not administered)  diphenhydrAMINE (BENADRYL) injection 50 mg (not administered)     Initial Impression / Assessment and Plan / ED Course  I have reviewed the triage vital  signs and the nursing notes.  Pertinent labs & imaging results that were available during my care of the patient were reviewed by me and considered in my medical decision making (see chart for details).     60yM with likely ACEI induced angioedema. Observed with improvement of symptoms. Advised to stop lisinopril and list as allergy. Return precautions discussed. Outpt FU to discuss further BP management.   Final Clinical Impressions(s) / ED Diagnoses   Final diagnoses:  Angioedema, initial encounter    New Prescriptions New Prescriptions   No medications on file   I personally preformed the services scribed in my presence. The recorded information has been reviewed is accurate. Virgel Manifold, MD.    Virgel Manifold, MD 07/08/16 1131

## 2022-09-28 DIAGNOSIS — H1032 Unspecified acute conjunctivitis, left eye: Secondary | ICD-10-CM | POA: Diagnosis not present

## 2022-10-24 DIAGNOSIS — N1831 Chronic kidney disease, stage 3a: Secondary | ICD-10-CM | POA: Diagnosis not present

## 2022-10-24 DIAGNOSIS — I10 Essential (primary) hypertension: Secondary | ICD-10-CM | POA: Diagnosis not present

## 2022-10-24 DIAGNOSIS — M25512 Pain in left shoulder: Secondary | ICD-10-CM | POA: Diagnosis not present

## 2022-10-24 DIAGNOSIS — Z6826 Body mass index (BMI) 26.0-26.9, adult: Secondary | ICD-10-CM | POA: Diagnosis not present

## 2022-10-24 DIAGNOSIS — E1169 Type 2 diabetes mellitus with other specified complication: Secondary | ICD-10-CM | POA: Diagnosis not present

## 2022-10-24 DIAGNOSIS — J019 Acute sinusitis, unspecified: Secondary | ICD-10-CM | POA: Diagnosis not present

## 2022-11-06 DIAGNOSIS — M19011 Primary osteoarthritis, right shoulder: Secondary | ICD-10-CM | POA: Diagnosis not present

## 2022-11-06 DIAGNOSIS — M25512 Pain in left shoulder: Secondary | ICD-10-CM | POA: Diagnosis not present

## 2022-11-06 DIAGNOSIS — M19012 Primary osteoarthritis, left shoulder: Secondary | ICD-10-CM | POA: Diagnosis not present

## 2023-01-01 DIAGNOSIS — C4441 Basal cell carcinoma of skin of scalp and neck: Secondary | ICD-10-CM | POA: Diagnosis not present

## 2023-01-01 DIAGNOSIS — L821 Other seborrheic keratosis: Secondary | ICD-10-CM | POA: Diagnosis not present

## 2023-01-01 DIAGNOSIS — C44319 Basal cell carcinoma of skin of other parts of face: Secondary | ICD-10-CM | POA: Diagnosis not present

## 2023-01-01 DIAGNOSIS — L308 Other specified dermatitis: Secondary | ICD-10-CM | POA: Diagnosis not present

## 2023-01-01 DIAGNOSIS — D485 Neoplasm of uncertain behavior of skin: Secondary | ICD-10-CM | POA: Diagnosis not present

## 2023-01-01 DIAGNOSIS — L719 Rosacea, unspecified: Secondary | ICD-10-CM | POA: Diagnosis not present

## 2023-01-02 DIAGNOSIS — Z823 Family history of stroke: Secondary | ICD-10-CM | POA: Diagnosis not present

## 2023-01-02 DIAGNOSIS — Z85828 Personal history of other malignant neoplasm of skin: Secondary | ICD-10-CM | POA: Diagnosis not present

## 2023-01-02 DIAGNOSIS — Z809 Family history of malignant neoplasm, unspecified: Secondary | ICD-10-CM | POA: Diagnosis not present

## 2023-01-02 DIAGNOSIS — Z833 Family history of diabetes mellitus: Secondary | ICD-10-CM | POA: Diagnosis not present

## 2023-01-02 DIAGNOSIS — Z8249 Family history of ischemic heart disease and other diseases of the circulatory system: Secondary | ICD-10-CM | POA: Diagnosis not present

## 2023-01-02 DIAGNOSIS — M109 Gout, unspecified: Secondary | ICD-10-CM | POA: Diagnosis not present

## 2023-01-02 DIAGNOSIS — E1122 Type 2 diabetes mellitus with diabetic chronic kidney disease: Secondary | ICD-10-CM | POA: Diagnosis not present

## 2023-01-02 DIAGNOSIS — M1990 Unspecified osteoarthritis, unspecified site: Secondary | ICD-10-CM | POA: Diagnosis not present

## 2023-01-02 DIAGNOSIS — Z87891 Personal history of nicotine dependence: Secondary | ICD-10-CM | POA: Diagnosis not present

## 2023-01-02 DIAGNOSIS — I129 Hypertensive chronic kidney disease with stage 1 through stage 4 chronic kidney disease, or unspecified chronic kidney disease: Secondary | ICD-10-CM | POA: Diagnosis not present

## 2023-01-02 DIAGNOSIS — Z888 Allergy status to other drugs, medicaments and biological substances status: Secondary | ICD-10-CM | POA: Diagnosis not present

## 2023-01-02 DIAGNOSIS — N1831 Chronic kidney disease, stage 3a: Secondary | ICD-10-CM | POA: Diagnosis not present

## 2023-02-07 DIAGNOSIS — L719 Rosacea, unspecified: Secondary | ICD-10-CM | POA: Diagnosis not present

## 2023-02-07 DIAGNOSIS — D485 Neoplasm of uncertain behavior of skin: Secondary | ICD-10-CM | POA: Diagnosis not present

## 2023-03-12 DIAGNOSIS — C44319 Basal cell carcinoma of skin of other parts of face: Secondary | ICD-10-CM | POA: Diagnosis not present

## 2023-03-26 DIAGNOSIS — C4441 Basal cell carcinoma of skin of scalp and neck: Secondary | ICD-10-CM | POA: Diagnosis not present

## 2023-04-05 DIAGNOSIS — C44319 Basal cell carcinoma of skin of other parts of face: Secondary | ICD-10-CM | POA: Diagnosis not present

## 2023-04-05 DIAGNOSIS — D485 Neoplasm of uncertain behavior of skin: Secondary | ICD-10-CM | POA: Diagnosis not present

## 2023-04-05 DIAGNOSIS — L308 Other specified dermatitis: Secondary | ICD-10-CM | POA: Diagnosis not present

## 2023-04-05 DIAGNOSIS — L821 Other seborrheic keratosis: Secondary | ICD-10-CM | POA: Diagnosis not present

## 2023-04-05 DIAGNOSIS — L570 Actinic keratosis: Secondary | ICD-10-CM | POA: Diagnosis not present

## 2023-04-16 DIAGNOSIS — C44319 Basal cell carcinoma of skin of other parts of face: Secondary | ICD-10-CM | POA: Diagnosis not present

## 2023-05-01 DIAGNOSIS — D696 Thrombocytopenia, unspecified: Secondary | ICD-10-CM | POA: Diagnosis not present

## 2023-05-01 DIAGNOSIS — E782 Mixed hyperlipidemia: Secondary | ICD-10-CM | POA: Diagnosis not present

## 2023-05-01 DIAGNOSIS — M109 Gout, unspecified: Secondary | ICD-10-CM | POA: Diagnosis not present

## 2023-05-01 DIAGNOSIS — R972 Elevated prostate specific antigen [PSA]: Secondary | ICD-10-CM | POA: Diagnosis not present

## 2023-05-01 DIAGNOSIS — E1169 Type 2 diabetes mellitus with other specified complication: Secondary | ICD-10-CM | POA: Diagnosis not present

## 2023-05-01 DIAGNOSIS — M2040 Other hammer toe(s) (acquired), unspecified foot: Secondary | ICD-10-CM | POA: Diagnosis not present

## 2023-05-01 DIAGNOSIS — Z6827 Body mass index (BMI) 27.0-27.9, adult: Secondary | ICD-10-CM | POA: Diagnosis not present

## 2023-05-01 DIAGNOSIS — Z125 Encounter for screening for malignant neoplasm of prostate: Secondary | ICD-10-CM | POA: Diagnosis not present

## 2023-05-01 DIAGNOSIS — Z0000 Encounter for general adult medical examination without abnormal findings: Secondary | ICD-10-CM | POA: Diagnosis not present

## 2023-05-01 DIAGNOSIS — E559 Vitamin D deficiency, unspecified: Secondary | ICD-10-CM | POA: Diagnosis not present

## 2023-05-01 DIAGNOSIS — I10 Essential (primary) hypertension: Secondary | ICD-10-CM | POA: Diagnosis not present

## 2023-05-23 DIAGNOSIS — Z136 Encounter for screening for cardiovascular disorders: Secondary | ICD-10-CM | POA: Diagnosis not present

## 2023-05-23 DIAGNOSIS — Z87891 Personal history of nicotine dependence: Secondary | ICD-10-CM | POA: Diagnosis not present

## 2023-06-25 DIAGNOSIS — H16142 Punctate keratitis, left eye: Secondary | ICD-10-CM | POA: Diagnosis not present

## 2023-06-25 DIAGNOSIS — H16223 Keratoconjunctivitis sicca, not specified as Sjogren's, bilateral: Secondary | ICD-10-CM | POA: Diagnosis not present

## 2023-07-10 DIAGNOSIS — L578 Other skin changes due to chronic exposure to nonionizing radiation: Secondary | ICD-10-CM | POA: Diagnosis not present

## 2023-07-10 DIAGNOSIS — L814 Other melanin hyperpigmentation: Secondary | ICD-10-CM | POA: Diagnosis not present

## 2023-07-10 DIAGNOSIS — Z85828 Personal history of other malignant neoplasm of skin: Secondary | ICD-10-CM | POA: Diagnosis not present

## 2023-07-10 DIAGNOSIS — L820 Inflamed seborrheic keratosis: Secondary | ICD-10-CM | POA: Diagnosis not present

## 2023-07-10 DIAGNOSIS — L570 Actinic keratosis: Secondary | ICD-10-CM | POA: Diagnosis not present

## 2023-07-10 DIAGNOSIS — B078 Other viral warts: Secondary | ICD-10-CM | POA: Diagnosis not present

## 2023-07-10 DIAGNOSIS — L821 Other seborrheic keratosis: Secondary | ICD-10-CM | POA: Diagnosis not present

## 2023-07-10 DIAGNOSIS — D1801 Hemangioma of skin and subcutaneous tissue: Secondary | ICD-10-CM | POA: Diagnosis not present

## 2023-07-18 DIAGNOSIS — R311 Benign essential microscopic hematuria: Secondary | ICD-10-CM | POA: Diagnosis not present

## 2023-07-18 DIAGNOSIS — N281 Cyst of kidney, acquired: Secondary | ICD-10-CM | POA: Diagnosis not present

## 2023-09-13 DIAGNOSIS — H02882 Meibomian gland dysfunction right lower eyelid: Secondary | ICD-10-CM | POA: Diagnosis not present

## 2023-09-13 DIAGNOSIS — H02885 Meibomian gland dysfunction left lower eyelid: Secondary | ICD-10-CM | POA: Diagnosis not present

## 2023-10-23 DIAGNOSIS — R972 Elevated prostate specific antigen [PSA]: Secondary | ICD-10-CM | POA: Diagnosis not present

## 2023-10-23 DIAGNOSIS — Z0000 Encounter for general adult medical examination without abnormal findings: Secondary | ICD-10-CM | POA: Diagnosis not present

## 2023-10-23 DIAGNOSIS — E1169 Type 2 diabetes mellitus with other specified complication: Secondary | ICD-10-CM | POA: Diagnosis not present

## 2023-10-29 DIAGNOSIS — Z09 Encounter for follow-up examination after completed treatment for conditions other than malignant neoplasm: Secondary | ICD-10-CM | POA: Diagnosis not present

## 2023-10-29 DIAGNOSIS — Z6827 Body mass index (BMI) 27.0-27.9, adult: Secondary | ICD-10-CM | POA: Diagnosis not present

## 2023-10-29 DIAGNOSIS — E1169 Type 2 diabetes mellitus with other specified complication: Secondary | ICD-10-CM | POA: Diagnosis not present

## 2023-11-13 DIAGNOSIS — L821 Other seborrheic keratosis: Secondary | ICD-10-CM | POA: Diagnosis not present

## 2023-11-13 DIAGNOSIS — C44319 Basal cell carcinoma of skin of other parts of face: Secondary | ICD-10-CM | POA: Diagnosis not present

## 2023-11-13 DIAGNOSIS — L814 Other melanin hyperpigmentation: Secondary | ICD-10-CM | POA: Diagnosis not present

## 2023-11-13 DIAGNOSIS — D225 Melanocytic nevi of trunk: Secondary | ICD-10-CM | POA: Diagnosis not present

## 2023-11-13 DIAGNOSIS — Z85828 Personal history of other malignant neoplasm of skin: Secondary | ICD-10-CM | POA: Diagnosis not present

## 2023-11-13 DIAGNOSIS — L578 Other skin changes due to chronic exposure to nonionizing radiation: Secondary | ICD-10-CM | POA: Diagnosis not present

## 2023-11-13 DIAGNOSIS — D485 Neoplasm of uncertain behavior of skin: Secondary | ICD-10-CM | POA: Diagnosis not present

## 2023-12-19 DIAGNOSIS — C44319 Basal cell carcinoma of skin of other parts of face: Secondary | ICD-10-CM | POA: Diagnosis not present
